# Patient Record
Sex: Female | Born: 1950 | Hispanic: Yes | Marital: Married | Smoking: Never smoker
Health system: Southern US, Community
[De-identification: ages and names within clinical notes are randomized; demographics above are authoritative.]

## PROBLEM LIST (undated history)

## (undated) DIAGNOSIS — E785 Hyperlipidemia, unspecified: Secondary | ICD-10-CM

## (undated) HISTORY — DX: Hyperlipidemia, unspecified: E78.5

## (undated) HISTORY — PX: CHOLECYSTECTOMY: SHX55

---

## 2020-02-28 ENCOUNTER — Other Ambulatory Visit: Payer: Self-pay

## 2020-02-28 ENCOUNTER — Ambulatory Visit: Payer: Self-pay | Admitting: Physician Assistant

## 2020-02-28 VITALS — BP 138/89 | HR 74 | Temp 98.7°F | Resp 18 | Ht 60.0 in | Wt 157.0 lb

## 2020-02-28 DIAGNOSIS — M25461 Effusion, right knee: Secondary | ICD-10-CM

## 2020-02-28 DIAGNOSIS — E669 Obesity, unspecified: Secondary | ICD-10-CM

## 2020-02-28 DIAGNOSIS — E785 Hyperlipidemia, unspecified: Secondary | ICD-10-CM

## 2020-02-28 DIAGNOSIS — Z79899 Other long term (current) drug therapy: Secondary | ICD-10-CM

## 2020-02-28 DIAGNOSIS — Z6830 Body mass index (BMI) 30.0-30.9, adult: Secondary | ICD-10-CM

## 2020-02-28 DIAGNOSIS — M25561 Pain in right knee: Secondary | ICD-10-CM

## 2020-02-28 DIAGNOSIS — G4700 Insomnia, unspecified: Secondary | ICD-10-CM | POA: Insufficient documentation

## 2020-02-28 DIAGNOSIS — K219 Gastro-esophageal reflux disease without esophagitis: Secondary | ICD-10-CM

## 2020-02-28 MED ORDER — ATORVASTATIN CALCIUM 10 MG PO TABS: 10.0000 mg | ORAL_TABLET | Freq: Every day | ORAL | 3 refills | Status: AC

## 2020-02-28 MED ORDER — HYDROXYZINE HCL 25 MG PO TABS: 25.0000 mg | ORAL_TABLET | Freq: Every evening | ORAL | 0 refills | Status: AC | PRN

## 2020-02-28 MED ORDER — OMEPRAZOLE 20 MG PO CPDR: 20.0000 mg | DELAYED_RELEASE_CAPSULE | Freq: Every day | ORAL | 3 refills | Status: AC

## 2020-02-28 NOTE — Patient Instructions (Signed)
Continue with pain cream, heat and start using a brace (ace bandage) to help support right knee  Follow up in the [facility] and [facility] to establish primary care   Dolor de rodilla crnico en los adultos Chronic Knee Pain, Adult El dolor de rodilla crnico es el dolor en una o en ambas rodillas que dura ms de 3 meses. Los sntomas de dolor de rodilla crnico pueden incluir hinchazn, rigidez y Tree surgeon. El desgaste de la articulacin de la rodilla (osteoartritis) relacionado con la edad es la causa ms frecuente de dolor de rodilla crnico. Otras causas posibles incluyen lo siguiente:  Una enfermedad a largo plazo relacionada con el sistema inmunitario que causa inflamacin de la rodilla (artritis reumatoide). Por lo general, afecta ambas rodillas.  Artritis inflamatoria, como gota o pseudogota.  Una lesin en la rodilla que causa artritis.  Una lesin en la rodilla que daa los ligamentos. Los ligamentos son tejidos fuertes que [employer] s.  Rodilla de corredor o dolor detrs de la rtula. El tratamiento para el dolor de rodilla crnico depende de su causa. Los tratamientos principales para el dolor de rodilla crnico son la fisioterapia y la prdida de [place]. Esta afeccin tambin puede tratarse con medicamentos, inyecciones, una rodillera o un dispositivo ortopdico, y el uso de [place]. Adems, puede recomendarse el tratamiento con reposo, hielo, compresin (presin) y elevacin (RHCE). Siga estas instrucciones en su casa: Si tiene una rodillera o un dispositivo ortopdico:   selos como se lo haya indicado el mdico. Quteselos solamente como se lo haya indicado el mdico.  Afljelos si siente hormigueo en los dedos de los pies, si se le entumecen, o se le enfran y se tornan de Optician, dispensing.  Mantngalos limpios.  Si la rodillera o el dispositivo ortopdico no son impermeables: ? No deje que se mojen. ? [place], si el mdico se lo permite, o cbralo con un  envoltorio hermtico cuando tome un bao de inmersin o [place]. Control del dolor, la rigidez y la hinchazn      Si se lo indican, aplique calor en la zona afectada con la frecuencia que le haya indicado el mdico. Use la fuente de calor que el mdico le recomiende, como una compresa de calor hmedo o una almohadilla trmica. ? Si [place] una rodillera o un dispositivo ortopdico, quteselo segn las indicaciones del mdico. ? Colquese una toalla entre la piel y la fuente de Freight forwarder. ? Aplique calor durante 20 a 34mnutos. ? Retire la fuente de calor si la piel se pone de color rojo brillante. Esto es especialmente importante si no puede sentir dolor, calor o fro. Puede correr un riesgo mayor de sufrir quemaduras.  Si se lo indican, aplique hielo sobre la zona afectada. ? Si uCanadauna rodillera o un dispositivo ortopdico, quteselo segn las indicaciones del mdico. ? Ponga el hielo en una bolsa plstica. ? Coloque una tGenuine Partspiel y lTherapist, nutritional ? Coloque el hielo durante 225mutos, 2 a 3veces por da.  Mueva los dedos del pie con frecuencia para reducir la rigidez y la hinchazn.  Cuando est sentado o acostado, levante (eleve) la zona lesionada por encima del nivel del corazn. Actividad  Evite las actividades en las que ambos pies no estn en contacto con el piso al mismo tiempo (actividades de alto impacto). Algunos ejemplos son correr, saArt therapistoga y hacer saltos de tijera.  Retome sus actividades normales segn lo indicado por el mdico. Pregntele al mdITT Industries  son seguras para usted.  Siga el plan de ejercicios que el mdico dise para usted. El mdico puede recomendarle lo siguiente: ? Product/process development scientist las actividades que empeoren el dolor de rodilla. Esto puede exigirle que modifique sus rutinas de ejercicio, participacin en deportes u obligaciones laborales. ? Usar calzado con suelas acolchonadas. ? Evitar las actividades o los deportes de alto impacto que  requieran correr y Quar[place] manager de direccin sbitamente. ? Realice fisioterapia como se lo haya indicado el mdico. La fisioterapia est planificada para satisfacer sus necesidades y capacidades. Puede incluir ejercicios para la fuerza, la flexibilidad, la estabilidad y la resistencia. ? Haga ejercicios que mejoren el equilibrio y la fuerza, [place] tai chi y el yoga.  No apoye el peso del cuerpo [employer]KE extremidad [employer] que lo autorice el mdico. Use muletas, un bastn o un andador, segn se lo haya indicado el mdico. Instrucciones generales  Use los medicamentos de venta libre y los recetados solamente como se lo haya indicado el mdico.  Baje de peso si es necesario. Perder incluso un poco de peso puede reducir el dolor de rodilla. Pregntele al mdico cul es su peso ideal y cmo Horticulturist, commercial sin riesgos. Un experto en alimentacin (nutricionista) puede ayudarlo a planificar sus comidas.  No consuma ningn producto que contenga nicotina o tabaco, como cigarrillos, cigarrillos electrnicos y tabaco de Higher education careers adviser. Estos pueden retrasar la recuperacin. Si necesita ayuda para dejar de fumar, consulte al mdico.  Concurra a todas las visitas de seguimiento como se lo haya indicado el mdico. Esto es importante. Comunquese con un mdico si:  Tiene un dolor de rodilla que no mejora o que [place].  No puede realizar los ejercicios de fisioterapia debido al dolor de rodilla. Solicite ayuda inmediatamente si:  La rodilla se hincha y la hinchazn empeora.  No puede mover la rodilla.  Siente dolor intenso en la rodilla. Resumen  El dolor de rodilla que dura ms de 3 meses se considera dolor de rodilla crnico.  Los tratamientos principales para el dolor de rodilla crnico son la fisioterapia y la prdida de [place]. Tambin es posible que deba tomar medicamentos, usar una rodillera o un dispositivo ortopdico, usar muletas y aplicarse hielo o calor en la rodilla.  Perder incluso un poco de  peso puede reducir el dolor de rodilla. Pregntele al mdico cul es su peso ideal y cmo Horticulturist, commercial sin riesgos. Un experto en alimentacin (nutricionista) puede ayudarlo a planificar sus comidas.  Trabaje con un fisioterapeuta para crear un programa de ejercicios seguros, segn lo indique el mdico. Esta informacin no tiene Marine scientist el consejo del mdico. Asegrese de hacerle al mdico cualquier pregunta que tenga. Document Revised: 03/07/2019 Document Reviewed: 03/07/2019 Elsevier Patient Education  [place].

## 2020-02-28 NOTE — Progress Notes (Signed)
New Patient Office Visit  Subjective:  Patient ID: Maureen Horn, female    DOB: [birth year]  Age: 69 y.o. MRN: [id number]  CC:  Chief Complaint  Patient presents with  . Knee Pain  . Hyperlipidemia    HPI Maureen Horn presents for pain in her right knee.  Reports that she has had this pain for the last 5 years.  Reports that she will have swelling in her knee, some days worse than others.  Does endorse that occasionally it will get swollen to the point she is unable to walk.  Uses topical pain relief ointment warm compresses with a small amount of relief.  Recently moved to [place] to live with daughter, was living in [place] and was receiving medical treatment there.  Has a piece of paper with her that shows that she had been approved for an ultrasound of this area, states that she did not get that completed.  Reports that she was treated for elevated cholesterol, does have empty prescription bottle, states she has been out of medication for the last 3 weeks.  Reports that she takes Prilosec on occasion when she has heartburn and acid reflux, this offers relief, does have empty prescription bottle for this as well.  Reports that she does travel to [place] several months out of the year, does receive medical treatment there.  Reports that she is prescribed Klonopin 2 mg for insomnia.  Reports it does not offer much relief, states that she has difficulty falling asleep and staying asleep, states that she sleeps approximately 4 hours at night.  Reports she had her first COVID-19 vaccine, is due for second shot in 3 weeks.  Daughter is present and helps with history.  Due to language barrier, an interpreter was present during the history-taking and subsequent discussion (and for part of the physical exam) with this patient.  History reviewed. No pertinent past medical history.  History reviewed. No pertinent surgical history.  History reviewed. No  pertinent family history.  Social History   Socioeconomic History  . Marital status: Married    Spouse name: Not on file  . Number of children: Not on file  . Years of education: Not on file  . Highest education level: Not on file  Occupational History  . Not on file  Tobacco Use  . Smoking status: Never Smoker  . Smokeless tobacco: Never Used  Substance and Sexual Activity  . Alcohol use: Never  . Drug use: Never  . Sexual activity: Not Currently  Other Topics Concern  . Not on file  Social History Narrative  . Not on file   Social Determinants of Health   Financial Resource Strain:   . Difficulty of Paying Living Expenses:   Food Insecurity:   . Worried About Programme researcher, broadcasting/film/video in the Last Year:   . Barista in the Last Year:   Transportation Needs:   . Freight forwarder (Medical):   Marland Kitchen Lack of Transportation (Non-Medical):   Physical Activity:   . Days of Exercise per Week:   . Minutes of Exercise per Session:   Stress:   . Feeling of Stress :   Social Connections:   . Frequency of Communication with Friends and Family:   . Frequency of Social Gatherings with Friends and Family:   . Attends Religious Services:   . Active Member of Clubs or Organizations:   . Attends Banker Meetings:   Marland Kitchen Marital Status:  Intimate Partner Violence:   . Fear of Current or Ex-Partner:   . Emotionally Abused:   Marland Kitchen Physically Abused:   . Sexually Abused:     ROS Review of Systems  Constitutional: Positive for fatigue. Negative for chills and fever.  HENT: Negative.   Eyes: Negative.   Respiratory: Negative.   Cardiovascular: Positive for leg swelling.  Gastrointestinal: Negative.   Endocrine: Negative.   Genitourinary: Negative.   Musculoskeletal: Positive for arthralgias.  Skin: Negative.  Negative for rash and wound.  Allergic/Immunologic: Negative.   Neurological: Negative.   Hematological: Negative.   Psychiatric/Behavioral: Positive for  sleep disturbance. Negative for dysphoric mood. The patient is not nervous/anxious.     Objective:   Today's Vitals: BP 138/89 (BP Location: Left Arm, Patient Position: Sitting, Cuff Size: Large)   Pulse 74   Temp 98.7 F (37.1 C) (Oral)   Resp 18   Ht 5' (1.524 m)   Wt 156 lb 15.5 oz (71.2 kg)   SpO2 97%   BMI 30.66 kg/m   Physical Exam Vitals and nursing note reviewed.  Constitutional:      Appearance: Normal appearance. She is obese.  HENT:     Head: Normocephalic and atraumatic.     Right Ear: External ear normal.     Left Ear: External ear normal.     Mouth/Throat:     Mouth: Mucous membranes are moist.  Eyes:     Extraocular Movements: Extraocular movements intact.     Conjunctiva/sclera: Conjunctivae normal.     Pupils: Pupils are equal, round, and reactive to light.  Cardiovascular:     Rate and Rhythm: Normal rate and regular rhythm.     Pulses: Normal pulses.     Heart sounds: Normal heart sounds.  Pulmonary:     Effort: Pulmonary effort is normal.     Breath sounds: Normal breath sounds.  Abdominal:     General: Abdomen is flat. Bowel sounds are normal.     Palpations: Abdomen is soft.  Musculoskeletal:     Right knee: Swelling present. Tenderness present.     Left knee: Normal.       Legs:  Skin:    General: Skin is warm and dry.  Neurological:     General: No focal deficit present.     Mental Status: She is alert and oriented to person, place, and time.  Psychiatric:        Mood and Affect: Mood normal.        Behavior: Behavior normal.        Thought Content: Thought content normal.        Judgment: Judgment normal.     Assessment & Plan:   Problem List Items Addressed This Visit      Other   Hyperlipemia   Relevant Medications   atorvastatin (LIPITOR) 10 MG tablet   Other Relevant Orders   Lipid panel   Insomnia   Relevant Medications   hydrOXYzine (ATARAX/VISTARIL) 25 MG tablet   Other Relevant Orders   TSH    Other Visit  Diagnoses    Pain and swelling of right knee    -  Primary   Gastroesophageal reflux disease without esophagitis       Relevant Medications   omeprazole (PRILOSEC) 20 MG capsule   Class 1 obesity with body mass index (BMI) of 30.0 to 30.9 in adult, unspecified obesity type, unspecified whether serious comorbidity present       Relevant Orders   CBC with  Differential/Platelet   Comp. Metabolic Panel (12)   Encounter for long-term current use of medication       Relevant Orders   Comp. Metabolic Panel (12)      Outpatient Encounter Medications as of 02/28/2020  Medication Sig  . clonazePAM (KLONOPIN) 2 MG tablet Take 2 mg by mouth 2 (two) times daily.  . [DISCONTINUED] atorvastatin (LIPITOR) 10 MG tablet Take 10 mg by mouth daily.  . [DISCONTINUED] omeprazole (PRILOSEC) 20 MG capsule Take 20 mg by mouth daily.  Marland Kitchen atorvastatin (LIPITOR) 10 MG tablet Take 1 tablet (10 mg total) by mouth daily.  . hydrOXYzine (ATARAX/VISTARIL) 25 MG tablet Take 1 tablet (25 mg total) by mouth at bedtime as needed.  Marland Kitchen omeprazole (PRILOSEC) 20 MG capsule Take 1 capsule (20 mg total) by mouth daily.   No facility-administered encounter medications on file as of 02/28/2020.  1. Hyperlipidemia, unspecified hyperlipidemia type Patient is prescription bottles showing that she was taking atorvastatin 10 mg, will refill medication today, lipid panel completed - Lipid panel - atorvastatin (LIPITOR) 10 MG tablet; Take 1 tablet (10 mg total) by mouth daily.  Dispense: 90 tablet; Refill: 3  2. Insomnia, unspecified type Trial hydroxyzine 25 mg at bedtime, encouraged patient to avoid using clonazepam due to potential adverse effects - TSH - hydrOXYzine (ATARAX/VISTARIL) 25 MG tablet; Take 1 tablet (25 mg total) by mouth at bedtime as needed.  Dispense: 30 tablet; Refill: 0  3. Pain and swelling of right knee Continue topical pain creams warm compresses, gave patient education on using an Ace bandage to offer some  support.  Patient had paper with her where she was previously given an authorization to get an ultrasound when she lived in [place], patient did not have this completed.  4. Gastroesophageal reflux disease without esophagitis Patient had prescription bottle for omeprazole 20 mg, refilled medication - omeprazole (PRILOSEC) 20 MG capsule; Take 1 capsule (20 mg total) by mouth daily.  Dispense: 30 capsule; Refill: 3  5. Class 1 obesity with body mass index (BMI) of 30.0 to 30.9 in adult, unspecified obesity type, unspecified whether serious comorbidity present  - CBC with Differential/Platelet - Comp. Metabolic Panel (12)  6. Encounter for long-term current use of medication  - Comp. Metabolic Panel (12)  Patient and daughter were given information about financial assistance through [facility] health, patient was given [place] [facility] to establish primary care and further evaluation of knee pain  I have reviewed the patient's medical history (PMH, PSH, Social History, Family History, Medications, and allergies) , and have been updated if relevant. I spent 30 minutes reviewing chart and  face to face time with patient.        Follow-up: Return in about 3 weeks (around 03/20/2020) for to establish PCP at [facility].   Kasandra Knudsen Mayers, PA-C

## 2020-02-29 LAB — CBC WITH DIFFERENTIAL/PLATELET
Basophils Absolute: 0 10*3/uL (ref 0.0–0.2)
Basos: 1 %
EOS (ABSOLUTE): 0 10*3/uL (ref 0.0–0.4)
Eos: 1 %
Hematocrit: 46.8 % — ABNORMAL HIGH (ref 34.0–46.6)
Hemoglobin: 15.7 g/dL (ref 11.1–15.9)
Immature Grans (Abs): 0 10*3/uL (ref 0.0–0.1)
Immature Granulocytes: 1 %
Lymphocytes Absolute: 1.7 10*3/uL (ref 0.7–3.1)
Lymphs: 39 %
MCH: 32 pg (ref 26.6–33.0)
MCHC: 33.5 g/dL (ref 31.5–35.7)
MCV: 95 fL (ref 79–97)
Monocytes Absolute: 0.3 10*3/uL (ref 0.1–0.9)
Monocytes: 8 %
Neutrophils Absolute: 2.2 10*3/uL (ref 1.4–7.0)
Neutrophils: 50 %
Platelets: 190 10*3/uL (ref 150–450)
RBC: 4.91 x10E6/uL (ref 3.77–5.28)
RDW: 12.9 % (ref 11.7–15.4)
WBC: 4.3 10*3/uL (ref 3.4–10.8)

## 2020-02-29 LAB — COMP. METABOLIC PANEL (12)
AST: 27 IU/L (ref 0–40)
Albumin/Globulin Ratio: 1.3 (ref 1.2–2.2)
Albumin: 4.1 g/dL (ref 3.8–4.8)
Alkaline Phosphatase: 151 IU/L — ABNORMAL HIGH (ref 39–117)
BUN/Creatinine Ratio: 17 (ref 12–28)
BUN: 13 mg/dL (ref 8–27)
Bilirubin Total: 0.3 mg/dL (ref 0.0–1.2)
Calcium: 9.7 mg/dL (ref 8.7–10.3)
Chloride: 104 mmol/L (ref 96–106)
Creatinine, Ser: 0.78 mg/dL (ref 0.57–1.00)
GFR calc Af Amer: 90 mL/min/{1.73_m2} (ref >59)
GFR calc non Af Amer: 78 mL/min/{1.73_m2} (ref >59)
Globulin, Total: 3.1 g/dL (ref 1.5–4.5)
Glucose: 95 mg/dL (ref 65–99)
Potassium: 4.3 mmol/L (ref 3.5–5.2)
Sodium: 142 mmol/L (ref 134–144)
Total Protein: 7.2 g/dL (ref 6.0–8.5)

## 2020-02-29 LAB — LIPID PANEL
Chol/HDL Ratio: 4.3 ratio (ref 0.0–4.4)
Cholesterol, Total: 219 mg/dL — ABNORMAL HIGH (ref 100–199)
HDL: 51 mg/dL (ref >39)
LDL Chol Calc (NIH): 142 mg/dL — ABNORMAL HIGH (ref 0–99)
Triglycerides: 148 mg/dL (ref 0–149)
VLDL Cholesterol Cal: 26 mg/dL (ref 5–40)

## 2020-02-29 LAB — TSH: TSH: 0.576 u[IU]/mL (ref 0.450–4.500)

## 2020-03-14 ENCOUNTER — Ambulatory Visit: Payer: Self-pay | Admitting: Family Medicine

## 2020-03-14 ENCOUNTER — Telehealth: Payer: Self-pay | Admitting: *Deleted

## 2020-03-14 NOTE — Telephone Encounter (Signed)
-----  Message from Kennieth Rad, [place] sent at 02/29/2020  9:32 AM EDT ----- Has been off her cholesterol medication for the past 3 weeks, has appointment at [facility] in 3 weeks to establish primary care.  Alk phos is elevated.  The 10-year ASCVD risk score Mikey Bussing DC Brooke Bonito., et al., 2013) is: 9.3%   Values used to calculate the score:     Age: 69 years     Sex: Female     Is Non-Hispanic African American: No     Diabetic: No     Tobacco smoker: No     Systolic Blood Pressure: 277 mmHg     Is BP treated: No     HDL Cholesterol: 51 mg/dL     Total Cholesterol: 219 mg/dL  [place] -please call patient and let her know that we will review her lab results with her at her upcoming appointment. Thanks!

## 2020-03-14 NOTE — Telephone Encounter (Signed)
Medical Assistant used [employer] to contact patient. Interpreter Name:  [facility] Interpreter #: [phone]  Patient is aware of keeping her appt on 4/28 to discuss results at establishing appointment.

## 2020-03-27 ENCOUNTER — Other Ambulatory Visit: Payer: Self-pay

## 2020-03-27 ENCOUNTER — Encounter: Payer: Self-pay | Admitting: Nurse Practitioner

## 2020-03-27 ENCOUNTER — Ambulatory Visit: Payer: Self-pay | Attending: Family Medicine | Admitting: Nurse Practitioner

## 2020-03-27 DIAGNOSIS — G8929 Other chronic pain: Secondary | ICD-10-CM

## 2020-03-27 DIAGNOSIS — Z7689 Persons encountering health services in other specified circumstances: Secondary | ICD-10-CM

## 2020-03-27 DIAGNOSIS — K219 Gastro-esophageal reflux disease without esophagitis: Secondary | ICD-10-CM

## 2020-03-27 DIAGNOSIS — M25561 Pain in right knee: Secondary | ICD-10-CM

## 2020-03-27 DIAGNOSIS — R109 Unspecified abdominal pain: Secondary | ICD-10-CM

## 2020-03-27 DIAGNOSIS — R7401 Elevation of levels of liver transaminase levels: Secondary | ICD-10-CM

## 2020-03-27 MED ORDER — MELOXICAM 7.5 MG PO TABS: 7.5000 mg | ORAL_TABLET | Freq: Every day | ORAL | 1 refills | Status: AC

## 2020-03-27 NOTE — Progress Notes (Signed)
Virtual Visit via Telephone Note Due to national recommendations of social distancing due to [place] 19, telehealth visit is felt to be most appropriate for this patient at this time.  I discussed the limitations, risks, security and privacy concerns of performing an evaluation and management service by telephone and the availability of in person appointments. I also discussed with the patient that there may be a patient responsible charge related to this service. The patient expressed understanding and agreed to proceed.    I connected with Maureen Horn on 03/27/20  at   9:10 AM EDT  EDT by telephone and verified that I am speaking with the correct person using two identifiers.   Consent I discussed the limitations, risks, security and privacy concerns of performing an evaluation and management service by telephone and the availability of in person appointments. I also discussed with the patient that there may be a patient responsible charge related to this service. The patient expressed understanding and agreed to proceed.   Location of Patient: Private Residence    Location of Provider: [place] participating in Telemedicine visit: Geryl Rankins FNP-BC [place] Interpreter ID# [id number]   History of Present Illness: Telemedicine visit for: Establish Care  has a past medical history of Hyperlipidemia.  Moved to  one month ago from [place]. She does travel to [place] several months out of the year and receives medical treatment there.   Insomnia She was taking Klonopin 2 mg for insomnia and was instructed to switch to hydroxyzine. She never picked the hydroxyzine up from the pharmacy and continues to take Klonopin. I have instructed her and her daughter who is on the call as well that I do not prescribe Klonopin for insomnia or anxiety.    HEALTH MAINTENANCE Mammogram-Overdue. Has not been  performed in several years  Dyslipidemia LDL not at goal of <100 mg. Taking atorvastatin 10 mg daily as prescribed. Denies any statin intolerance.  Lab Results  Component Value Date   CHOL 219 (H) 02/28/2020   Lab Results  Component Value Date   HDL 51 02/28/2020   Lab Results  Component Value Date   LDLCALC 142 (H) 02/28/2020   Lab Results  Component Value Date   TRIG 148 02/28/2020   Lab Results  Component Value Date   CHOLHDL 4.3 02/28/2020   GERD Taking omeprazole 20 mg daily. She has never had a colonoscopy for colon cancer screening. Endorses worsening abdominal pain with eating. Has experienced decreased appetite, nausea and burning sensation in stomach despite taking omeprazole. She has been taking omeprazole prn. Instructed to take daily. Ongoing for several weeks.   Right knee pain Chronic for the last 5 years. Associated symptoms: swelling. Still with knee pain. Needs xray. Treatments tried: topical pain relief, warm compresses. Relieving factors: None.    Past Medical History:  Diagnosis Date  . Hyperlipidemia     Past Surgical History:  Procedure Laterality Date  . CHOLECYSTECTOMY      Family History  Problem Relation Age of Onset  . Hypertension Neg Hx   . Diabetes Neg Hx     Social History   Socioeconomic History  . Marital status: Married    Spouse name: Not on file  . Number of children: Not on file  . Years of education: Not on file  . Highest education level: Not on file  Occupational History  . Not on file  Tobacco Use  .  Smoking status: Never Smoker  . Smokeless tobacco: Never Used  Substance and Sexual Activity  . Alcohol use: Never  . Drug use: Never  . Sexual activity: Not Currently  Other Topics Concern  . Not on file  Social History Narrative  . Not on file   Social Determinants of Health   Financial Resource Strain:   . Difficulty of Paying Living Expenses:   Food Insecurity:   . Worried About Programme researcher, broadcasting/film/video in the  Last Year:   . Barista in the Last Year:   Transportation Needs:   . Freight forwarder (Medical):   Marland Kitchen Lack of Transportation (Non-Medical):   Physical Activity:   . Days of Exercise per Week:   . Minutes of Exercise per Session:   Stress:   . Feeling of Stress :   Social Connections:   . Frequency of Communication with Friends and Family:   . Frequency of Social Gatherings with Friends and Family:   . Attends Religious Services:   . Active Member of Clubs or Organizations:   . Attends Banker Meetings:   Marland Kitchen Marital Status:      Observations/Objective: Awake, alert and oriented x 3   Review of Systems  Constitutional: Negative for fever, malaise/fatigue and weight loss.  HENT: Negative.  Negative for nosebleeds.   Eyes: Negative.  Negative for blurred vision, double vision and photophobia.  Respiratory: Negative.  Negative for cough and shortness of breath.   Cardiovascular: Negative.  Negative for chest pain, palpitations and leg swelling.  Gastrointestinal: Positive for abdominal pain and heartburn. Negative for blood in stool, constipation, diarrhea, melena, nausea and vomiting.  Musculoskeletal: Positive for joint pain. Negative for myalgias.  Neurological: Negative.  Negative for dizziness, focal weakness, seizures and headaches.  Psychiatric/Behavioral: Negative.  Negative for suicidal ideas.    Assessment and Plan: Maureen Horn was seen today for establish care.  Diagnoses and all orders for this visit:  Encounter to establish care  Chronic pain of right knee -     meloxicam (MOBIC) 7.5 MG tablet; Take 1 tablet (7.5 mg total) by mouth daily. -     DG Knee Complete 4 Views Right; Future Work on losing weight to help reduce joint pain. May alternate with heat and ice application for pain relief. May also alternate with acetaminophen as prescribed pain relief. Other alternatives include massage, acupuncture and water aerobics.  You must stay active  and avoid a sedentary lifestyle.  Gastroesophageal reflux disease, unspecified whether esophagitis present -     Ambulatory referral to Gastroenterology INSTRUCTIONS: Avoid GERD Triggers: acidic, spicy or fried foods, caffeine, coffee, sodas,  alcohol and chocolate.   Chronic abdominal pain -     US Abdomen Complete; Future     Follow Up Instructions Return in about 3 months (around 06/26/2020).     I discussed the assessment and treatment plan with the patient. The patient was provided an opportunity to ask questions and all were answered. The patient agreed with the plan and demonstrated an understanding of the instructions.   The patient was advised to call back or seek an in-person evaluation if the symptoms worsen or if the condition fails to improve as anticipated.  I provided 18 minutes of non-face-to-face time during this encounter including median intraservice time, reviewing previous notes, labs, imaging, medications and explaining diagnosis and management.  Claiborne Rigg, FNP-BC

## 2020-03-28 ENCOUNTER — Encounter: Payer: Self-pay | Admitting: Nurse Practitioner

## 2020-03-30 ENCOUNTER — Emergency Department (HOSPITAL_COMMUNITY): Payer: Medicare (Managed Care)

## 2020-03-30 ENCOUNTER — Emergency Department (HOSPITAL_COMMUNITY)
Admission: EM | Admit: 2020-03-30 | Discharge: 2020-03-30 | Disposition: A | Discharge status: Home / Self Care | Payer: Medicare (Managed Care) | Attending: Emergency Medicine | Admitting: Emergency Medicine

## 2020-03-30 ENCOUNTER — Encounter (HOSPITAL_COMMUNITY): Payer: Self-pay | Admitting: Emergency Medicine

## 2020-03-30 ENCOUNTER — Other Ambulatory Visit: Payer: Self-pay

## 2020-03-30 DIAGNOSIS — R634 Abnormal weight loss: Secondary | ICD-10-CM | POA: Diagnosis not present

## 2020-03-30 DIAGNOSIS — R1084 Generalized abdominal pain: Secondary | ICD-10-CM

## 2020-03-30 DIAGNOSIS — K5900 Constipation, unspecified: Secondary | ICD-10-CM | POA: Insufficient documentation

## 2020-03-30 DIAGNOSIS — Z79899 Other long term (current) drug therapy: Secondary | ICD-10-CM | POA: Insufficient documentation

## 2020-03-30 DIAGNOSIS — R1030 Lower abdominal pain, unspecified: Secondary | ICD-10-CM | POA: Diagnosis not present

## 2020-03-30 DIAGNOSIS — R32 Unspecified urinary incontinence: Secondary | ICD-10-CM | POA: Insufficient documentation

## 2020-03-30 DIAGNOSIS — R319 Hematuria, unspecified: Secondary | ICD-10-CM | POA: Insufficient documentation

## 2020-03-30 DIAGNOSIS — R109 Unspecified abdominal pain: Secondary | ICD-10-CM | POA: Diagnosis present

## 2020-03-30 LAB — URINALYSIS, ROUTINE W REFLEX MICROSCOPIC
Bacteria, UA: NONE SEEN
Bilirubin Urine: NEGATIVE
Glucose, UA: NEGATIVE mg/dL
Ketones, ur: NEGATIVE mg/dL
Nitrite: NEGATIVE
Protein, ur: NEGATIVE mg/dL
Specific Gravity, Urine: 1.012 (ref 1.005–1.030)
pH: 5 (ref 5.0–8.0)

## 2020-03-30 LAB — CBC
HCT: 43.6 % (ref 36.0–46.0)
Hemoglobin: 14.4 g/dL (ref 12.0–15.0)
MCH: 31.1 pg (ref 26.0–34.0)
MCHC: 33 g/dL (ref 30.0–36.0)
MCV: 94.2 fL (ref 80.0–100.0)
Platelets: 222 10*3/uL (ref 150–400)
RBC: 4.63 MIL/uL (ref 3.87–5.11)
RDW: 12.7 % (ref 11.5–15.5)
WBC: 5.5 10*3/uL (ref 4.0–10.5)
nRBC: 0 % (ref 0.0–0.2)

## 2020-03-30 LAB — COMPREHENSIVE METABOLIC PANEL
ALT: 23 U/L (ref 0–44)
AST: 26 U/L (ref 15–41)
Albumin: 3.6 g/dL (ref 3.5–5.0)
Alkaline Phosphatase: 117 U/L (ref 38–126)
Anion gap: 11 (ref 5–15)
BUN: 10 mg/dL (ref 8–23)
CO2: 27 mmol/L (ref 22–32)
Calcium: 9.2 mg/dL (ref 8.9–10.3)
Chloride: 106 mmol/L (ref 98–111)
Creatinine, Ser: 0.63 mg/dL (ref 0.44–1.00)
GFR calc Af Amer: >60 mL/min (ref >60)
GFR calc non Af Amer: >60 mL/min (ref >60)
Glucose, Bld: 98 mg/dL (ref 70–99)
Potassium: 3.7 mmol/L (ref 3.5–5.1)
Sodium: 144 mmol/L (ref 135–145)
Total Bilirubin: 0.5 mg/dL (ref 0.3–1.2)
Total Protein: 6.8 g/dL (ref 6.5–8.1)

## 2020-03-30 LAB — POC OCCULT BLOOD, ED: Fecal Occult Bld: NEGATIVE

## 2020-03-30 LAB — LIPASE, BLOOD: Lipase: 22 U/L (ref 11–51)

## 2020-03-30 MED ORDER — SODIUM CHLORIDE 0.9% FLUSH
3.0000 mL | Freq: Once | INTRAVENOUS | Status: AC
  Administered 2020-03-30: 3 mL via INTRAVENOUS

## 2020-03-30 MED ORDER — IOHEXOL 300 MG/ML  SOLN
100.0000 mL | Freq: Once | INTRAMUSCULAR | Status: AC | PRN
  Administered 2020-03-30: 100 mL via INTRAVENOUS

## 2020-03-30 MED ORDER — SENNOSIDES-DOCUSATE SODIUM 8.6-50 MG PO TABS: 1.0000 | ORAL_TABLET | Freq: Every day | ORAL | 0 refills | Status: AC

## 2020-03-30 NOTE — ED Notes (Signed)
Pt transported to CT ?

## 2020-03-30 NOTE — ED Provider Notes (Signed)
[facility] EMERGENCY DEPARTMENT Provider Note   CSN: [id number] Arrival date & time: 03/30/20  7616     History Chief Complaint  Patient presents with  . Abdominal Pain    Maureen Horn is a 69 y.o. female.  HPI  5 caveat secondary to language barrier history obtained through translator services 69 year old female presents today complaining of diffuse abdominal pain that has been moderate and present for 1 month.  She feels like she is constipated.  She has had bowel movements regularly but states these have been a small amount.  She has lost 10 pounds.  She denies any fever, chills, nausea, vomiting, or diarrhea.  She does have some urinary incontinence, but this has been going on for some time.  She had 8 children.  She denies any abnormal vaginal bleeding.  She has not had any blood or dark tarry stools.  Denies any family history of cancer or, specifically, colon cancer.  She has had no previous GI work-up and has not had a colonoscopy in the past.     Past Medical History:  Diagnosis Date  . Hyperlipidemia     Patient Active Problem List   Diagnosis Date Noted  . Hyperlipemia 02/28/2020  . Insomnia 02/28/2020    Past Surgical History:  Procedure Laterality Date  . CHOLECYSTECTOMY       OB History   No obstetric history on file.     Family History  Problem Relation Age of Onset  . Hypertension Neg Hx   . Diabetes Neg Hx     Social History   Tobacco Use  . Smoking status: Never Smoker  . Smokeless tobacco: Never Used  Substance Use Topics  . Alcohol use: Never  . Drug use: Never    Home Medications Prior to Admission medications   Medication Sig Start Date End Date Taking? Authorizing Provider  atorvastatin (LIPITOR) 10 MG tablet Take 1 tablet (10 mg total) by mouth daily. 02/28/20   Mayers, Cari S, PA-C  clonazePAM (KLONOPIN) 2 MG tablet Take 2 mg by mouth 2 (two) times daily.    [provider]  hydrOXYzine  (ATARAX/VISTARIL) 25 MG tablet Take 1 tablet (25 mg total) by mouth at bedtime as needed. Patient not taking: Reported on 03/27/2020 02/28/20   Mayers, Cari S, PA-C  meloxicam (MOBIC) 7.5 MG tablet Take 1 tablet (7.5 mg total) by mouth daily. 03/27/20   Gildardo Pounds, NP  omeprazole (PRILOSEC) 20 MG capsule Take 1 capsule (20 mg total) by mouth daily. 02/28/20   Mayers, Cari S, PA-C    Allergies    Patient has no known allergies.  Review of Systems   Review of Systems  All other systems reviewed and are negative.   Physical Exam Updated Vital Signs BP 139/89 (BP Location: Left Arm)   Pulse 78   Temp 98.7 F (37.1 C) (Oral)   Resp 18   Wt 71.4 kg   SpO2 96%   BMI 30.76 kg/m   Physical Exam Vitals and nursing note reviewed.  Constitutional:      Appearance: She is well-developed and normal weight.  HENT:     Head: Normocephalic.     Mouth/Throat:     Mouth: Mucous membranes are moist.  Eyes:     Extraocular Movements: Extraocular movements intact.  Cardiovascular:     Rate and Rhythm: Normal rate and regular rhythm.  Abdominal:     General: A surgical scar is present. Bowel sounds are normal.  There is no distension.     Palpations: Abdomen is soft. There is shifting dullness. There is no fluid wave, splenomegaly, mass or pulsatile mass.     Tenderness: There is generalized abdominal tenderness and tenderness in the suprapubic area and left lower quadrant.     Hernia: No hernia is present.  Genitourinary:    Rectum: Normal. No mass or tenderness.  Skin:    General: Skin is warm and dry.     Capillary Refill: Capillary refill takes less than 2 seconds.     Coloration: Skin is not jaundiced or pale.     Findings: No erythema or rash.  Neurological:     General: No focal deficit present.     Mental Status: She is alert.     Cranial Nerves: No cranial nerve deficit.     Motor: No weakness.  Psychiatric:        Mood and Affect: Mood normal.        Behavior: Behavior  normal.     ED Results / Procedures / Treatments   Labs (all labs ordered are listed, but only abnormal results are displayed) Labs Reviewed  URINALYSIS, ROUTINE W REFLEX MICROSCOPIC - Abnormal; Notable for the following components:      Result Value   Hgb urine dipstick MODERATE (*)    Leukocytes,Ua TRACE (*)    All other components within normal limits  LIPASE, BLOOD  COMPREHENSIVE METABOLIC PANEL  CBC  OCCULT BLOOD X 1 CARD TO LAB, STOOL    EKG EKG Interpretation  Date/Time:  Saturday Mar 30 2020 08:33:04 EDT Ventricular Rate:  85 PR Interval:  136 QRS Duration: 78 QT Interval:  382 QTC Calculation: 454 R Axis:   68 Text Interpretation: Sinus rhythm with Premature atrial complexes Otherwise normal ECG Confirmed by Pattricia Boss [phone]) on 03/30/2020 9:19:10 AM   Radiology CT ABDOMEN PELVIS W CONTRAST  Result Date: 03/30/2020 CLINICAL DATA:  Abdominal distension. Constipation. Weight loss. EXAM: CT ABDOMEN AND PELVIS WITH CONTRAST TECHNIQUE: Multidetector CT imaging of the abdomen and pelvis was performed using the standard protocol following bolus administration of intravenous contrast. CONTRAST:  151m OMNIPAQUE IOHEXOL 300 MG/ML  SOLN COMPARISON:  None. FINDINGS: Lower chest: Linear subsegmental atelectasis in the posterior basal segments of both lower lobes. Descending thoracic aortic atherosclerotic calcification. Hepatobiliary: Prior cholecystectomy. Otherwise unremarkable. Pancreas: Unremarkable Spleen: Unremarkable Adrenals/Urinary Tract: Adrenal glands unremarkable. Small left renal cysts. Bilateral extrarenal pelvis. Otherwise unremarkable. Stomach/Bowel: Scattered air-fluid levels in nondilated loops of proximal small bowel without bowel wall dilatation, mesenteric stranding, or other significant associated abnormality, probably incidental. Normal appendix. Vascular/Lymphatic: Aortoiliac atherosclerotic vascular disease. Reproductive: Unremarkable Other: No supplemental  non-categorized findings. Musculoskeletal: Lumbar spondylosis and degenerative disc disease at L5-S1 resulting in mild left foraminal impingement. IMPRESSION: 1. A specific cause for the patient's symptoms and weight loss is not identified. 2. Aortic atherosclerosis. 3. Mild left foraminal impingement at L5-S1 due to spondylosis and degenerative disc disease. Aortic Atherosclerosis (ICD10-I70.0). Electronically Signed   By: WVan ClinesM.D.   On: 03/30/2020 12:48  CT reviewed  Procedures Procedures (including critical care time)  Medications Ordered in ED Medications  sodium chloride flush (NS) 0.9 % injection 3 mL (has no administration in time range)    ED Course  I have reviewed the triage vital signs and the nursing notes.  Pertinent labs & imaging results that were available during my care of the patient were reviewed by me and considered in my medical decision making (see chart for  details).  Clinical Course as of Mar 30 1252  Sat Mar 30, 2020  1200 C-Met reviewed and normalCBC reviewed and normalLipase reviewed and normal   [DR]  1200 Urine has few white blood cells, 0-5 red blood cells, and squamous epithelialWill advise follow-up of her scopic hematuria as outpatient.Patient denies any urinary tract infection symptoms.  Doubt this represents infection.   [DR]    Clinical Course User Index [DR] Pattricia Boss, MD   MDM Rules/Calculators/A&P                     69 year old female has had some diffuse abdominal pain for the past month.  She feels that she has been constipated although she has been having more regular bowel movements.  She has had some weight loss.  Patient had labs performed here as well as CT scan.  She has a urinalysis with microscopic hematuria.  No definite acute cause was seen of her abdominal pain on her CT.  Patient is not vomiting and does not appear toxic.  After review of her labs, urine, and CT scan, feel the patient stable for discharge.  Plan  patient patient on Peri-Colace for her constipation, refer to GI for follow-up regarding her pain as well as her weight loss.,  She will be instructed to be follow-up with her primary care doctor for review of her urine and follow-up of the hematuria.  The above were discussed with the patient and she voices understanding Final Clinical Impression(s) / ED Diagnoses Final diagnoses:  Generalized abdominal pain  Hematuria, unspecified type    Rx / DC Orders ED Discharge Orders    None       Pattricia Boss, MD 03/30/20 1301

## 2020-03-30 NOTE — ED Triage Notes (Signed)
C/o generalized abd pain with dysuria and constipation x 1 month.  Last BM- today- small amount.

## 2020-03-30 NOTE — Discharge Instructions (Signed)
These take medications as prescribed for constipation Call [facility] on Monday for follow-up. Please follow-up with your primary care doctor regarding microscopic blood in your urine. Return to the emergency department if you are worse, especially worsening pain, or inability to tolerate food.

## 2020-04-10 ENCOUNTER — Ambulatory Visit: Payer: Self-pay | Admitting: Nurse Practitioner

## 2020-06-26 ENCOUNTER — Ambulatory Visit: Payer: Self-pay | Admitting: Nurse Practitioner

## 2020-08-06 ENCOUNTER — Ambulatory Visit: Payer: Self-pay | Admitting: Nurse Practitioner

## 2020-09-11 ENCOUNTER — Ambulatory Visit: Payer: Self-pay | Admitting: Nurse Practitioner

## 2020-10-09 IMAGING — CT CT ABD-PELV W/ CM
2 of 5 series · 17 of 46 positions shown, 19 images · IV contrast (Omni 300)
Comparison: None.

CLINICAL DATA: Abdominal distension. Constipation. Weight loss.

EXAM:
CT ABDOMEN AND PELVIS WITH CONTRAST
TECHNIQUE: Multidetector CT imaging of the abdomen and pelvis was performed
using the standard protocol following bolus administration of
intravenous contrast.
CONTRAST:  100mL OMNIPAQUE IOHEXOL 300 MG/ML  SOLN

[Series 3: a/p w/ 5mm · axial · 0.91mm/px · z∈[-504,-120]mm · 14 of 89 slices shown, 16 images]
[im 6/89  soft-tissue]
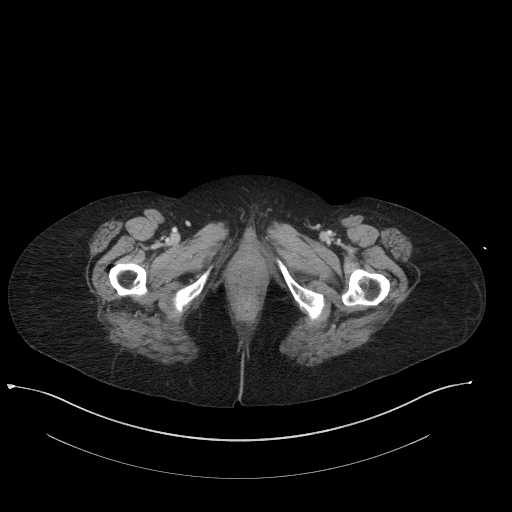
[im 6/89  bone]
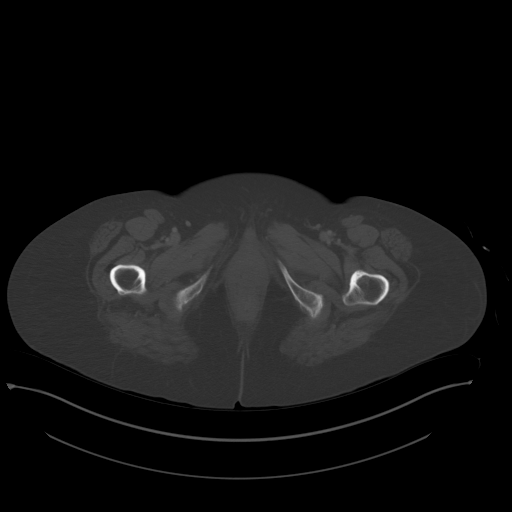
[im 11/89  soft-tissue]
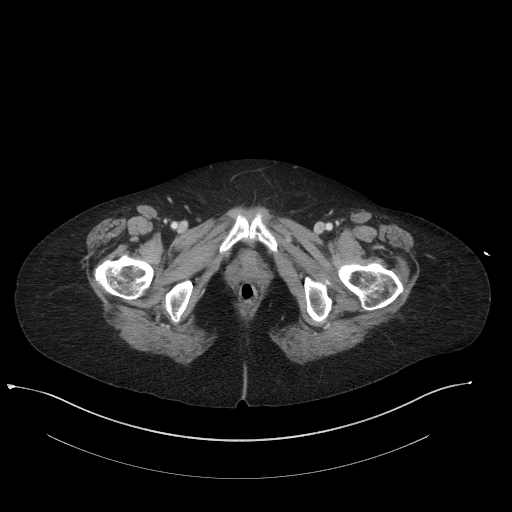
[im 16/89  soft-tissue]
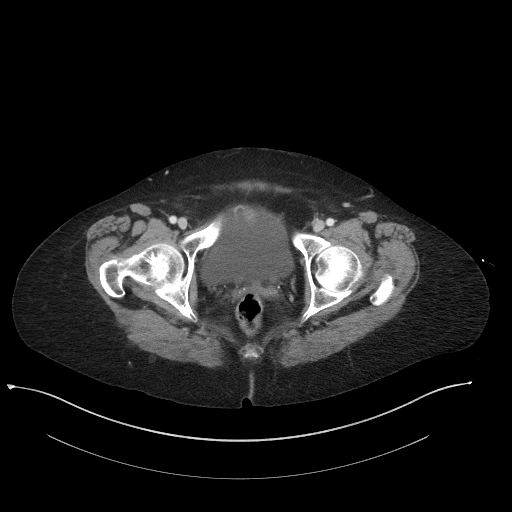
[im 26/89  soft-tissue]
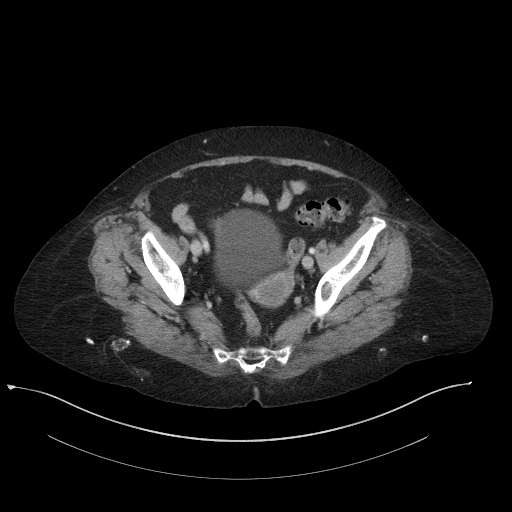
[im 32/89  soft-tissue]
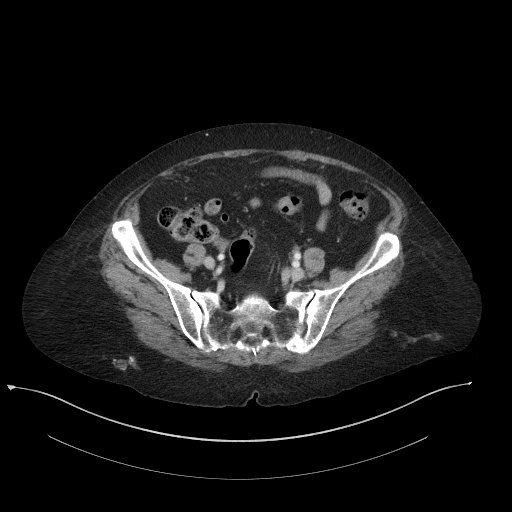
[im 37/89  soft-tissue]
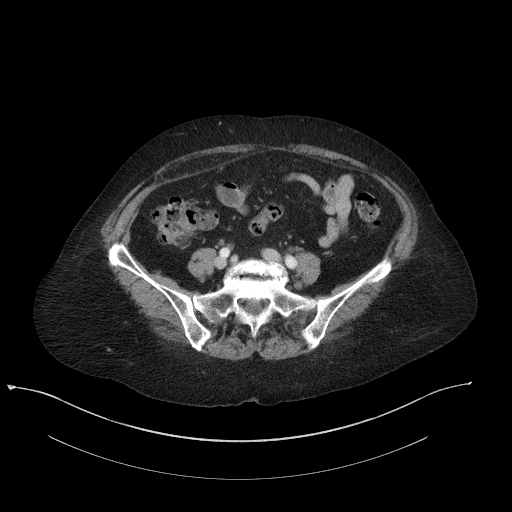
[im 42/89  soft-tissue]
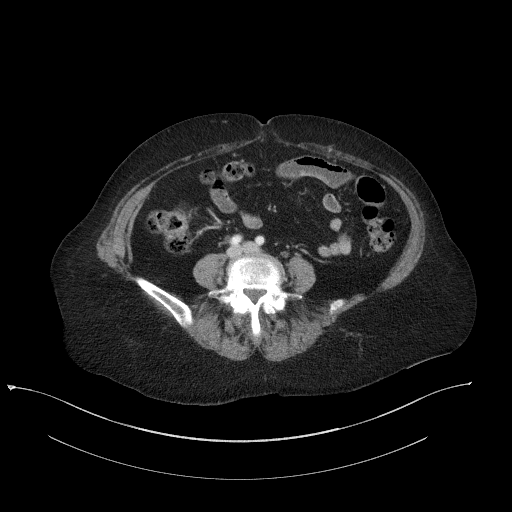
[im 47/89  soft-tissue]
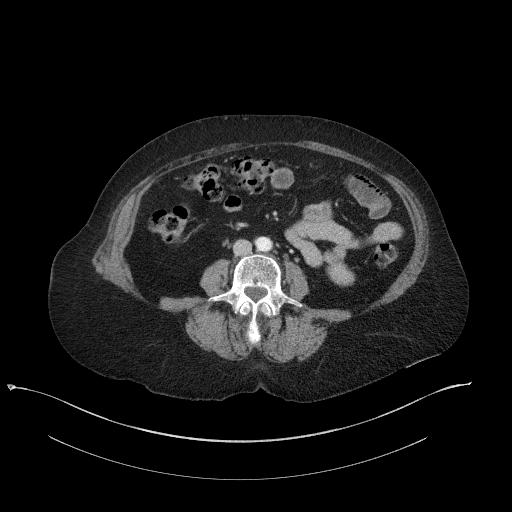
[im 52/89  soft-tissue]
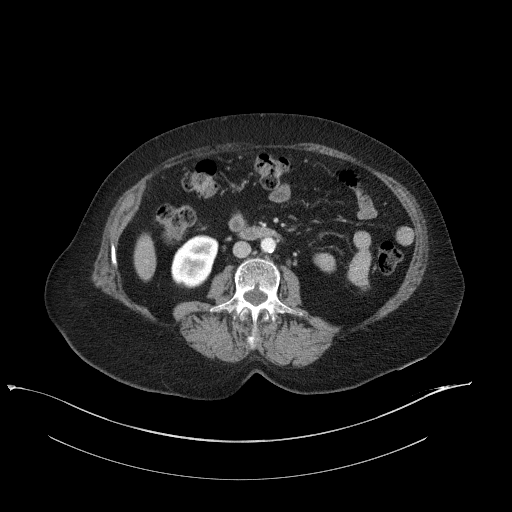
[im 52/89  bone]
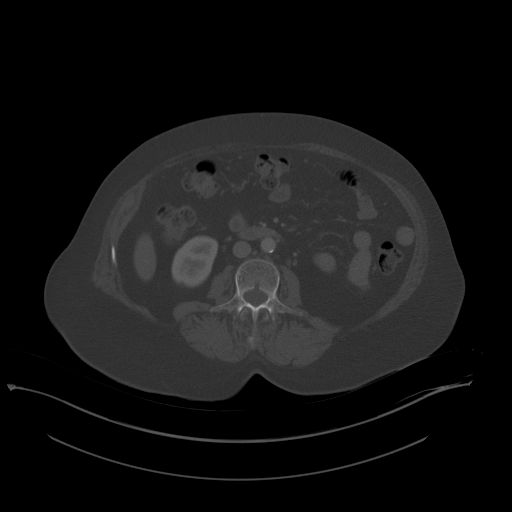
[im 57/89  soft-tissue]
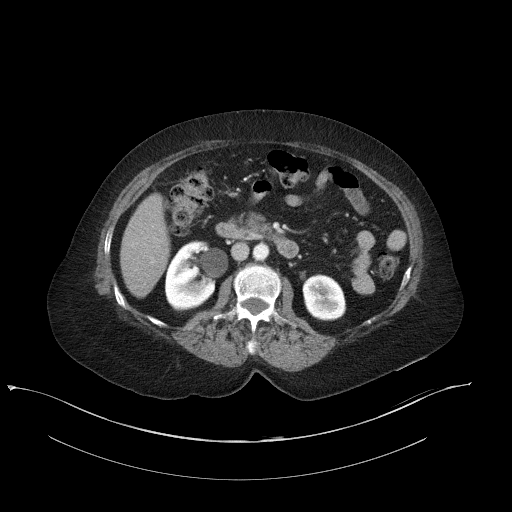
[im 68/89  soft-tissue]
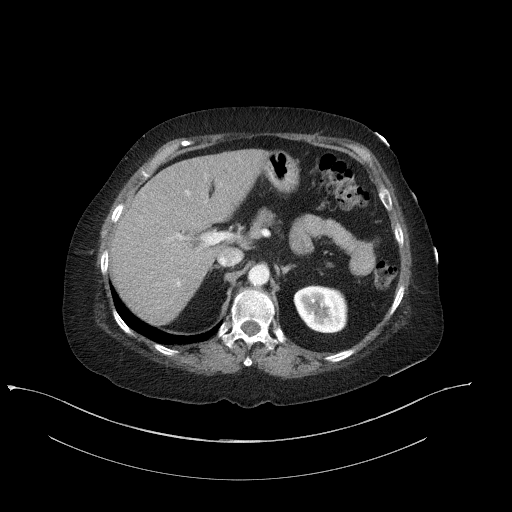
[im 73/89  soft-tissue]
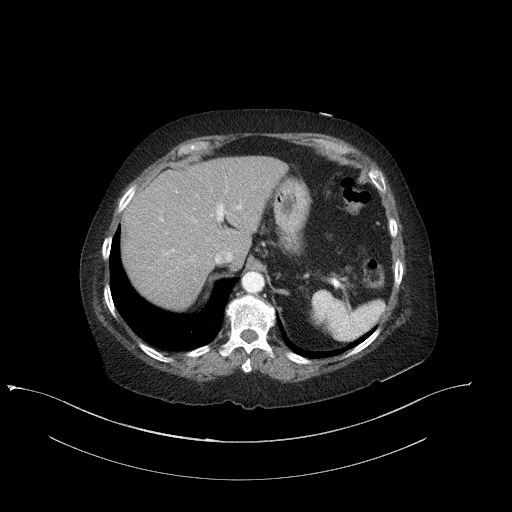
[im 78/89  soft-tissue]
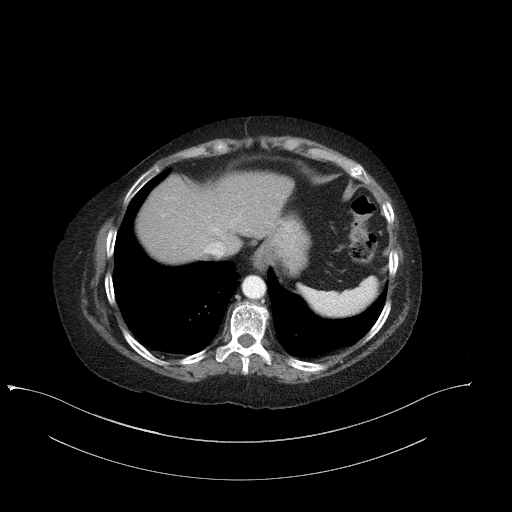
[im 83/89  soft-tissue]
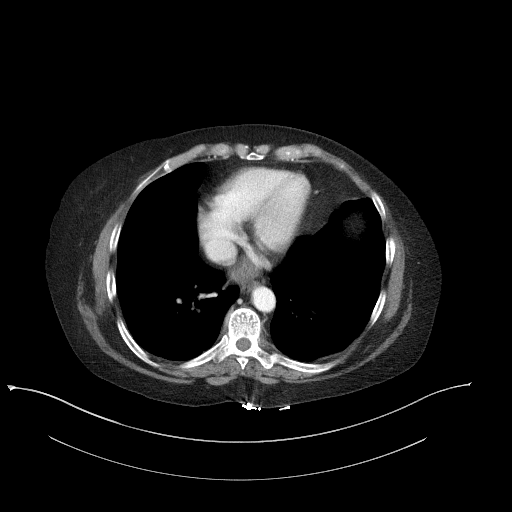

[Series 6: a/p w/ cor · coronal · 0.79mm/px · 3 of 117 slices shown]
[im 39/117  soft-tissue]
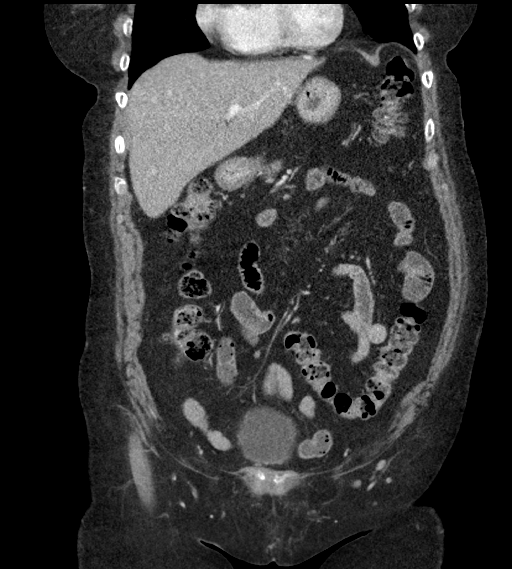
[im 52/117  soft-tissue]
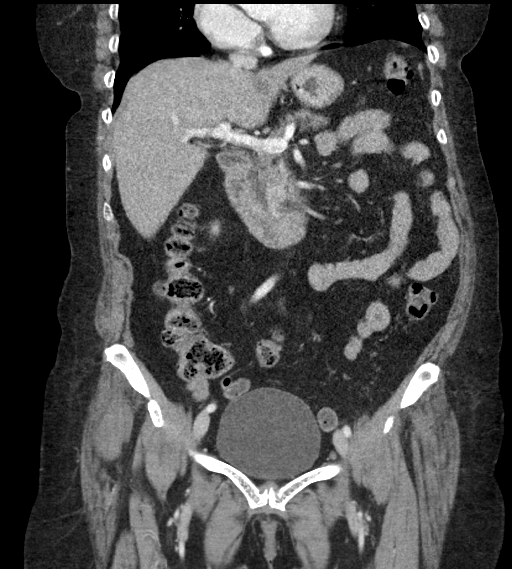
[im 65/117  soft-tissue]
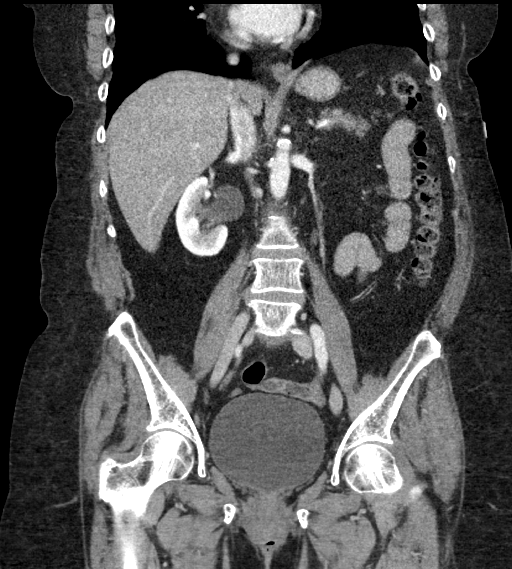

[17 of 46 positions shown; findings below may reference images not displayed]

FINDINGS: Lower chest: Linear subsegmental atelectasis in the posterior basal
segments of both lower lobes. Descending thoracic aortic
atherosclerotic calcification.

Hepatobiliary: Prior cholecystectomy. Otherwise unremarkable.

Pancreas: Unremarkable

Spleen: Unremarkable

Adrenals/Urinary Tract: Adrenal glands unremarkable. Small left
renal cysts. Bilateral extrarenal pelvis. Otherwise unremarkable.

Stomach/Bowel: Scattered air-fluid levels in nondilated loops of
proximal small bowel without bowel wall dilatation, mesenteric
stranding, or other significant associated abnormality, probably
incidental. Normal appendix.

Vascular/Lymphatic: Aortoiliac atherosclerotic vascular disease.

Reproductive: Unremarkable

Other: No supplemental non-categorized findings.

Musculoskeletal: Lumbar spondylosis and degenerative disc disease at
L5-S1 resulting in mild left foraminal impingement.
IMPRESSION: 1. A specific cause for the patient's symptoms and weight loss is
not identified.
2. Aortic atherosclerosis.
3. Mild left foraminal impingement at L5-S1 due to spondylosis and
degenerative disc disease.

Aortic Atherosclerosis (CDDNG-C67.7).
